# Patient Record
Sex: Female | Born: 1975 | Race: White | Hispanic: No | Marital: Married | Smoking: Never smoker
Health system: Southern US, Community
[De-identification: ages and names within clinical notes are randomized; demographics above are authoritative.]

## PROBLEM LIST (undated history)

## (undated) DIAGNOSIS — I10 Essential (primary) hypertension: Secondary | ICD-10-CM

## (undated) DIAGNOSIS — G43909 Migraine, unspecified, not intractable, without status migrainosus: Secondary | ICD-10-CM

## (undated) DIAGNOSIS — E282 Polycystic ovarian syndrome: Secondary | ICD-10-CM

## (undated) HISTORY — PX: CHOLECYSTECTOMY: SHX55

---

## 2018-01-02 ENCOUNTER — Encounter (HOSPITAL_COMMUNITY): Payer: Self-pay | Admitting: *Deleted

## 2018-01-02 ENCOUNTER — Emergency Department (HOSPITAL_COMMUNITY)
Admission: EM | Admit: 2018-01-02 | Discharge: 2018-01-02 | Disposition: A | Discharge status: Home / Self Care | Payer: 59 | Attending: Emergency Medicine | Admitting: Emergency Medicine

## 2018-01-02 ENCOUNTER — Emergency Department (HOSPITAL_COMMUNITY): Payer: 59

## 2018-01-02 DIAGNOSIS — N12 Tubulo-interstitial nephritis, not specified as acute or chronic: Secondary | ICD-10-CM | POA: Insufficient documentation

## 2018-01-02 DIAGNOSIS — Z79899 Other long term (current) drug therapy: Secondary | ICD-10-CM | POA: Diagnosis not present

## 2018-01-02 DIAGNOSIS — R1032 Left lower quadrant pain: Secondary | ICD-10-CM | POA: Diagnosis present

## 2018-01-02 DIAGNOSIS — I10 Essential (primary) hypertension: Secondary | ICD-10-CM | POA: Diagnosis not present

## 2018-01-02 HISTORY — DX: Essential (primary) hypertension: I10

## 2018-01-02 HISTORY — DX: Migraine, unspecified, not intractable, without status migrainosus: G43.909

## 2018-01-02 HISTORY — DX: Polycystic ovarian syndrome: E28.2

## 2018-01-02 LAB — BASIC METABOLIC PANEL
ANION GAP: 8 (ref 5–15)
BUN: 18 mg/dL (ref 6–20)
CHLORIDE: 110 mmol/L (ref 101–111)
CO2: 21 mmol/L — ABNORMAL LOW (ref 22–32)
Calcium: 8.8 mg/dL — ABNORMAL LOW (ref 8.9–10.3)
Creatinine, Ser: 0.82 mg/dL (ref 0.44–1.00)
GFR calc Af Amer: >60 mL/min (ref >60)
GLUCOSE: 115 mg/dL — AB (ref 65–99)
POTASSIUM: 4 mmol/L (ref 3.5–5.1)
SODIUM: 139 mmol/L (ref 135–145)

## 2018-01-02 LAB — URINALYSIS, ROUTINE W REFLEX MICROSCOPIC

## 2018-01-02 LAB — CBC WITH DIFFERENTIAL/PLATELET
BASOS ABS: 0 10*3/uL (ref 0.0–0.1)
Basophils Relative: 0 %
Eosinophils Absolute: 0.2 10*3/uL (ref 0.0–0.7)
Eosinophils Relative: 2 %
HEMATOCRIT: 37.8 % (ref 36.0–46.0)
Hemoglobin: 12.4 g/dL (ref 12.0–15.0)
LYMPHS ABS: 2.7 10*3/uL (ref 0.7–4.0)
LYMPHS PCT: 32 %
MCH: 29.2 pg (ref 26.0–34.0)
MCHC: 32.8 g/dL (ref 30.0–36.0)
MCV: 88.9 fL (ref 78.0–100.0)
Monocytes Absolute: 0.7 10*3/uL (ref 0.1–1.0)
Monocytes Relative: 8 %
NEUTROS ABS: 4.9 10*3/uL (ref 1.7–7.7)
Neutrophils Relative %: 58 %
Platelets: 394 10*3/uL (ref 150–400)
RBC: 4.25 MIL/uL (ref 3.87–5.11)
RDW: 13 % (ref 11.5–15.5)
WBC: 8.4 10*3/uL (ref 4.0–10.5)

## 2018-01-02 LAB — URINALYSIS, MICROSCOPIC (REFLEX)

## 2018-01-02 LAB — POC URINE PREG, ED: PREG TEST UR: NEGATIVE

## 2018-01-02 MED ORDER — HYDROMORPHONE HCL 1 MG/ML IJ SOLN
1.0000 mg | Freq: Once | INTRAMUSCULAR | Status: AC
  Administered 2018-01-02: 0.5 mg via INTRAVENOUS
  Filled 2018-01-02: qty 1

## 2018-01-02 MED ORDER — HYDROCODONE-ACETAMINOPHEN 5-325 MG PO TABS: 1.0000 | ORAL_TABLET | Freq: Four times a day (QID) | ORAL | 0 refills | Status: AC | PRN

## 2018-01-02 MED ORDER — ONDANSETRON 4 MG PO TBDP: 4.0000 mg | ORAL_TABLET | Freq: Three times a day (TID) | ORAL | 0 refills | Status: AC | PRN

## 2018-01-02 MED ORDER — SODIUM CHLORIDE 0.9 % IV BOLUS (SEPSIS)
1000.0000 mL | Freq: Once | INTRAVENOUS | Status: AC
  Administered 2018-01-02: 1000 mL via INTRAVENOUS

## 2018-01-02 MED ORDER — SULFAMETHOXAZOLE-TRIMETHOPRIM 800-160 MG PO TABS: 1.0000 | ORAL_TABLET | Freq: Two times a day (BID) | ORAL | 0 refills | Status: AC

## 2018-01-02 MED ORDER — ONDANSETRON HCL 4 MG/2ML IJ SOLN
4.0000 mg | Freq: Once | INTRAMUSCULAR | Status: AC | PRN
  Administered 2018-01-02: 4 mg via INTRAVENOUS
  Filled 2018-01-02: qty 2

## 2018-01-02 NOTE — ED Notes (Signed)
IV Ultrasound requested. Charge is in route to room.

## 2018-01-02 NOTE — ED Notes (Signed)
Pain in lower left back and suprapubic region. Pain in lower back began this morning.

## 2018-01-02 NOTE — ED Notes (Signed)
ED PA at bedside

## 2018-01-02 NOTE — Discharge Instructions (Signed)
Please read instructions below. Take your antibiotic, Bactrim, twice daily until it is gone. Drink plenty of water. You can take hydrocodone every 6 hours as needed for severe pain. Do not drive, drink alcohol, or take tylenol while taking this medication. You can take zofran every 8 hours as needed for nausea. Schedule an appointment with your primary care provider to follow up in 1 week. Return to the ER if you develop a fever, uncontrollable vomiting, or new or concerning symptoms.

## 2018-01-02 NOTE — ED Provider Notes (Signed)
[facility] Provider Note   CSN: [id number] Arrival date & time: 01/02/18  0734     History   Chief Complaint Chief Complaint  Patient presents with  . Flank Pain    HPI Judy Solis is a 42 y.o. female with past medical history of hypertension, PCO S, nephrolithiasis, presenting to the ED with 2 days of dysuria.  Patient states she has had burning with urination and suprapubic discomfort.  She states this morning she began having left flank pain, that is intermittent and stabbing in nature.  Also states her urine today was red.  Has been taking AZO for the past 2 days for urinary symptoms without relief.  States the flank pain feels similar to her history of nephrolithiasis, last episode about 7 years ago.  States she did not need intervention to pass stone.  Denies associated fever, nausea or vomiting, pelvic complaints.  The history is provided by the patient.    Past Medical History:  Diagnosis Date  . Hypertension   . Migraines   . Polycystic ovaries     There are no active problems to display for this patient.   Past Surgical History:  Procedure Laterality Date  . CHOLECYSTECTOMY      OB History    No data available       Home Medications    Prior to Admission medications   Medication Sig Start Date End Date Taking? Authorizing Provider  ibuprofen (ADVIL,MOTRIN) 200 MG tablet Take 800 mg by mouth every 6 (six) hours as needed for mild pain.   Yes [provider]  lisinopril (PRINIVIL,ZESTRIL) 10 MG tablet Take 10 mg by mouth daily.   Yes [provider]  loratadine (CLARITIN) 10 MG tablet Take 10 mg by mouth daily as needed for allergies.   Yes [provider]  norethindrone-ethinyl estradiol (BALZIVA) 0.4-35 MG-MCG tablet Take 1 tablet by mouth daily.   Yes [provider]  phenazopyridine (PYRIDIUM) 95 MG tablet Take 95 mg by mouth 3 (three) times daily as needed for pain.   Yes [provider]  propranolol (INDERAL) 40 MG tablet Take 40 mg by mouth 2 (two) times daily.   Yes [provider]  HYDROcodone-acetaminophen (NORCO/VICODIN) 5-325 MG tablet Take 1-2 tablets by mouth every 6 (six) hours as needed for severe pain. 01/02/18   Robinson, [place] N, PA-C  ondansetron (ZOFRAN ODT) 4 MG disintegrating tablet Take 1 tablet (4 mg total) by mouth every 8 (eight) hours as needed for nausea or vomiting. 01/02/18   Robinson, [place] N, PA-C  sulfamethoxazole-trimethoprim (BACTRIM DS,SEPTRA DS) 800-160 MG tablet Take 1 tablet by mouth 2 (two) times daily for 14 days. 01/02/18 01/16/18  Robinson, [place] N, PA-C    Family History No family history on file.  Social History Social History   Tobacco Use  . Smoking status: Never Smoker  . Smokeless tobacco: Never Used  Substance Use Topics  . Alcohol use: Yes  . Drug use: No     Allergies   Tuberculin tests   Review of Systems Review of Systems  Constitutional: Negative for chills and fever.  Gastrointestinal: Positive for abdominal pain. Negative for nausea and vomiting.  Genitourinary: Positive for dysuria, flank pain and hematuria. Negative for vaginal bleeding and vaginal discharge.  All other systems reviewed and are negative.    Physical Exam Updated Vital Signs BP 129/82 (BP Location: Right Arm)   Pulse 74   Temp 98 F (36.7 C) (Oral)  Resp 18   LMP 11/28/2017 Comment: negative pregnancy test result 01-02-18  SpO2 100%   Physical Exam  Constitutional: She appears well-developed and well-nourished. No distress.  HENT:  Head: Normocephalic and atraumatic.  Mouth/Throat: Oropharynx is clear and moist.  Eyes: Conjunctivae are normal.  Cardiovascular: Normal rate, regular rhythm, normal heart sounds and intact distal pulses.  Pulmonary/Chest: Effort normal and breath sounds normal. No respiratory distress.  Abdominal: Soft. Normal appearance and bowel sounds are normal. She exhibits no distension and  no mass. There is tenderness (suprapubic). There is CVA tenderness (left). There is no rebound and no guarding.  Neurological: She is alert.  Skin: Skin is warm.  Psychiatric: She has a normal mood and affect. Her behavior is normal.  Nursing note and vitals reviewed.    ED Treatments / Results  Labs (all labs ordered are listed, but only abnormal results are displayed) Labs Reviewed  URINALYSIS, ROUTINE W REFLEX MICROSCOPIC - Abnormal; Notable for the following components:      Result Value   Color, Urine RED (*)    APPearance CLOUDY (*)    Glucose, UA   (*)    Value: TEST NOT REPORTED DUE TO COLOR INTERFERENCE OF URINE PIGMENT   Hgb urine dipstick   (*)    Value: TEST NOT REPORTED DUE TO COLOR INTERFERENCE OF URINE PIGMENT   Bilirubin Urine   (*)    Value: TEST NOT REPORTED DUE TO COLOR INTERFERENCE OF URINE PIGMENT   Ketones, ur   (*)    Value: TEST NOT REPORTED DUE TO COLOR INTERFERENCE OF URINE PIGMENT   Protein, ur   (*)    Value: TEST NOT REPORTED DUE TO COLOR INTERFERENCE OF URINE PIGMENT   Nitrite   (*)    Value: TEST NOT REPORTED DUE TO COLOR INTERFERENCE OF URINE PIGMENT   Leukocytes, UA   (*)    Value: TEST NOT REPORTED DUE TO COLOR INTERFERENCE OF URINE PIGMENT   All other components within normal limits  BASIC METABOLIC PANEL - Abnormal; Notable for the following components:   CO2 21 (*)    Glucose, Bld 115 (*)    Calcium 8.8 (*)    All other components within normal limits  URINALYSIS, MICROSCOPIC (REFLEX) - Abnormal; Notable for the following components:   Bacteria, UA MANY (*)    Squamous Epithelial / LPF 0-5 (*)    All other components within normal limits  URINE CULTURE  CBC WITH DIFFERENTIAL/PLATELET  POC URINE PREG, ED    EKG  EKG Interpretation None       Radiology Ct Renal Stone Study  Result Date: 01/02/2018 CLINICAL DATA:  43 year old female with 2 hours of left flank pain, and burning with urination for the past 2 days. EXAM: CT ABDOMEN  AND PELVIS WITHOUT CONTRAST TECHNIQUE: Multidetector CT imaging of the abdomen and pelvis was performed following the standard protocol without IV contrast. COMPARISON:  None. FINDINGS: Lower chest: Normal lung bases.  No pericardial or pleural effusion. Hepatobiliary: Surgically absent gallbladder. Negative noncontrast liver. Pancreas: Negative. Spleen: Negative. Adrenals/Urinary Tract: Normal adrenal glands. Punctate left lower pole nephrolithiasis. Punctate right midpole and 3 millimeter lower pole right nephrolithiasis. No perinephric stranding. No hydronephrosis. Negative left ureter. Negative right ureter. Unremarkable urinary bladder. Tiny left pelvic floor phlebolith. Stomach/Bowel: Decompressed and negative rectosigmoid colon. Decompressed and negative left colon. Negative transverse colon. Mild retained stool in the right colon. Normal appendix. Flocculated material in the distal small bowel which otherwise appears normal. No dilated  small bowel. Decompressed stomach and duodenum. No abdominal free air or free fluid. Vascular/Lymphatic: Vascular patency is not evaluated in the absence of IV contrast. No lymphadenopathy. Reproductive: Negative. Other: No pelvic free fluid. Musculoskeletal: No acute osseous abnormality identified. IMPRESSION: 1. Bilateral nephrolithiasis but no obstructive uropathy or inflammation identified in the abdomen or pelvis. 2. Normal appendix. Electronically Signed   By: Odessa Fleming M.D.   On: 01/02/2018 09:08    Procedures Procedures (including critical care time)  Medications Ordered in ED Medications  sodium chloride 0.9 % bolus 1,000 mL (0 mLs Intravenous Stopped 01/02/18 1054)  ondansetron (ZOFRAN) injection 4 mg (4 mg Intravenous Given 01/02/18 0921)  HYDROmorphone (DILAUDID) injection 1 mg (0.5 mg Intravenous Given 01/02/18 0921)     Initial Impression / Assessment and Plan / ED Course  I have reviewed the triage vital signs and the nursing notes.  Pertinent labs &  imaging results that were available during my care of the patient were reviewed by me and considered in my medical decision making (see chart for details).  Clinical Course as of Jan 02 1149  Sun Jan 02, 2018  1110 Results discussed with patient. She reports improvement in sx. Still denies nausea. Will d/c with abx and symptomatic medications.  [JR]    Clinical Course User Index [JR] Robinson, [place] N, PA-C    Pt with urinary sx and left flank pain, diagnosed with pyelonephritis. Afebrile, nontoxic-appearing. VSS. CT stone study w small stone bilaterally in poles, though no evidence of obstructive uropathy or inflammation. Normal creatinine. No leukocytosis. U/A with infection. Pt's sx treated in ED with improvement. Will discharge with Bactrim, and symptomatic management. Instructed to follow up with PCP. Strict return precautions discussed.   Patient discussed with Dr. Eudelia Bunch.  [place] Controlled Substance reporting System queried  Discussed results, findings, treatment and follow up. Patient advised of return precautions. Patient verbalized understanding and agreed with plan.   Final Clinical Impressions(s) / ED Diagnoses   Final diagnoses:  Pyelonephritis    ED Discharge Orders        Ordered    sulfamethoxazole-trimethoprim (BACTRIM DS,SEPTRA DS) 800-160 MG tablet  2 times daily     01/02/18 1114    ondansetron (ZOFRAN ODT) 4 MG disintegrating tablet  Every 8 hours PRN     01/02/18 1114    HYDROcodone-acetaminophen (NORCO/VICODIN) 5-325 MG tablet  Every 6 hours PRN     01/02/18 1114       Robinson, [place] N, [place] 01/02/18 1151    Nira Conn, MD 01/02/18 1745

## 2018-01-02 NOTE — ED Triage Notes (Signed)
Pt complains of left flank pain for the past 2 hours, burning while urinating for the past 2 days. Pt states pain became worse when she got in her car. Pt believes she has UTI, has hx of UTIs and kidney stones. Pt has been taken AZO for the past 2 days.

## 2018-01-04 LAB — URINE CULTURE: Culture: >=100000 — AB

## 2018-01-05 ENCOUNTER — Telehealth: Payer: Self-pay | Admitting: Emergency Medicine

## 2018-01-05 NOTE — Telephone Encounter (Signed)
Post ED Visit - Positive Culture Follow-up  Culture report reviewed by antimicrobial stewardship pharmacist:  []  Enzo BiNathan Batchelder, Pharm.D. []  Celedonio MiyamotoJeremy Frens, Pharm.D., BCPS AQ-ID []  Garvin FilaMike Maccia, Pharm.D., BCPS []  Georgina PillionElizabeth Martin, [address]Ph[address]D., BCPS []  Quartz HillMinh Pham, [place]arm[place]D., BCPS, AAHIVP []  Estella HuskMichelle Turner, Pharm.D., BCPS, AAHIVP []  Lysle Pearlachel Rumbarger, PharmD, BCPS []  Blake DivineShannon Parkey, PharmD []  Pollyann SamplesAndy Johnston, PharmD, BCPS [facility] [place] PharmD  Positive urine culture Treated with Sulfamethazole-trimethoprim, organism sensitive to the same and no further patient follow-up is required at this time.  Berle MullMiller, Zoey Bidwell 01/05/2018, 11:24 AM

## 2018-07-27 IMAGING — CT CT RENAL STONE PROTOCOL
2 of 4 series · 16 of 46 positions shown, 18 images · non-contrast
Comparison: None.

CLINICAL DATA: 41-year-old female with 2 hours of left flank pain,
and burning with urination for the past 2 days.

EXAM:
CT ABDOMEN AND PELVIS WITHOUT CONTRAST
TECHNIQUE: Multidetector CT imaging of the abdomen and pelvis was performed
following the standard protocol without IV contrast.

[Series 2: axial st · axial · 0.82mm/px · z∈[-435,-20]mm · 13 of 95 slices shown, 15 images]
[im 6/95  soft-tissue]
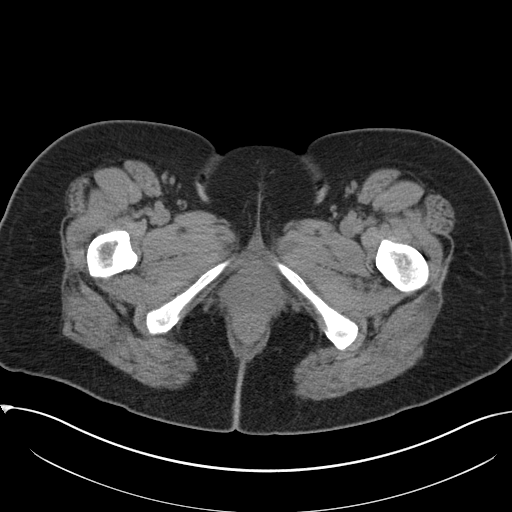
[im 6/95  bone]
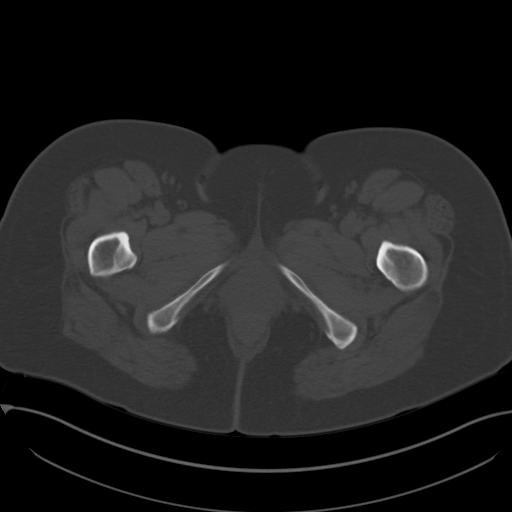
[im 11/95  soft-tissue]
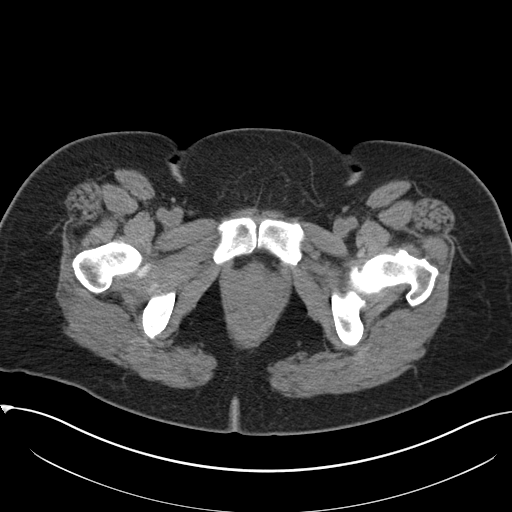
[im 21/95  soft-tissue]
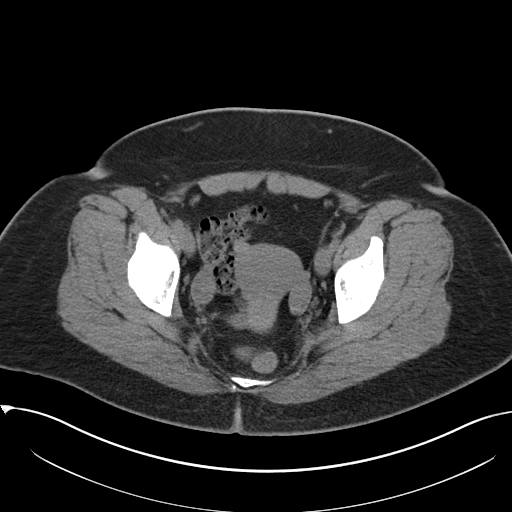
[im 27/95  soft-tissue]
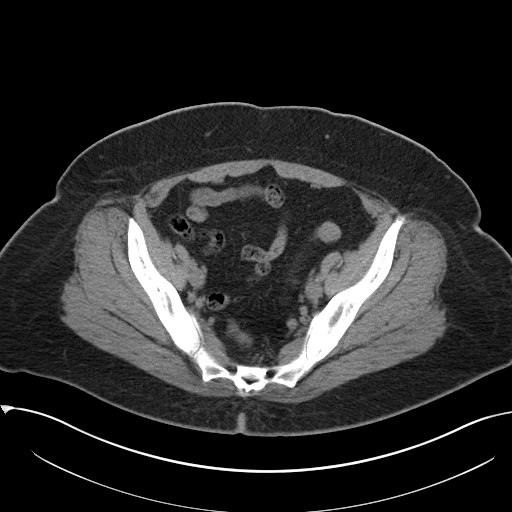
[im 32/95  soft-tissue]
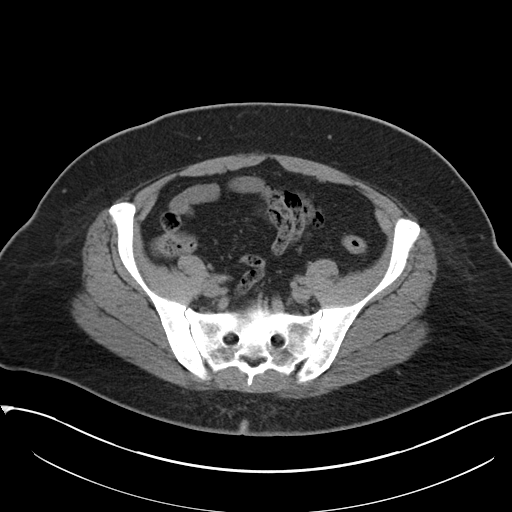
[im 42/95  soft-tissue]
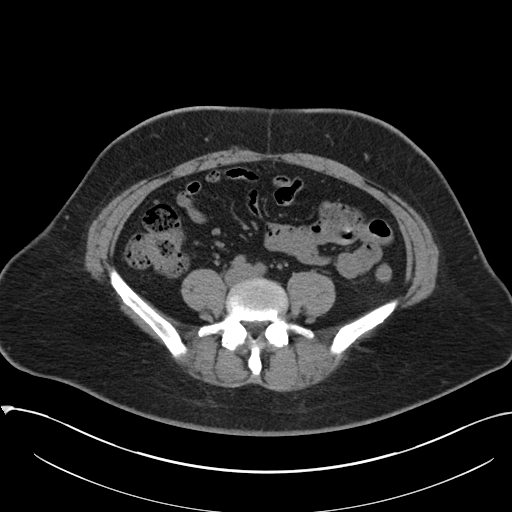
[im 48/95  soft-tissue]
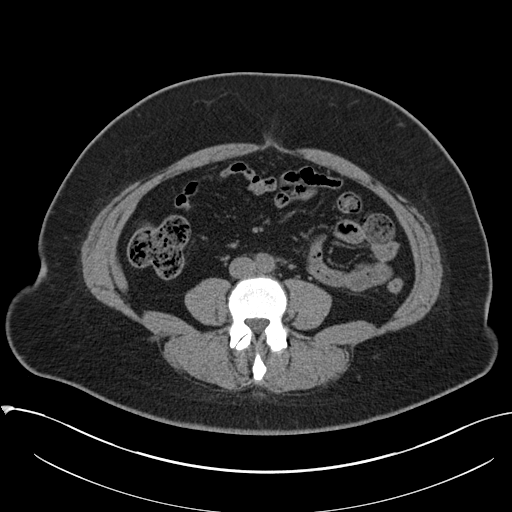
[im 53/95  soft-tissue]
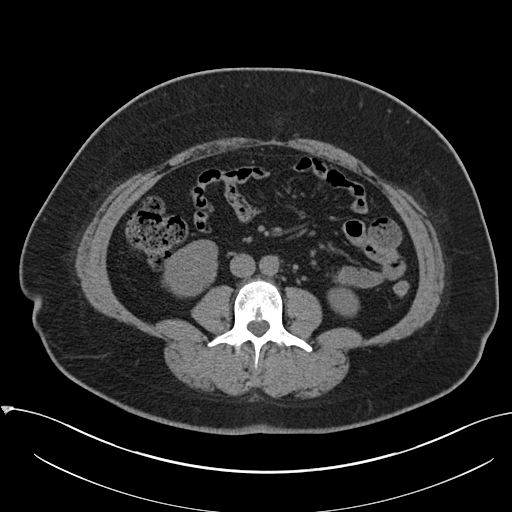
[im 63/95  soft-tissue]
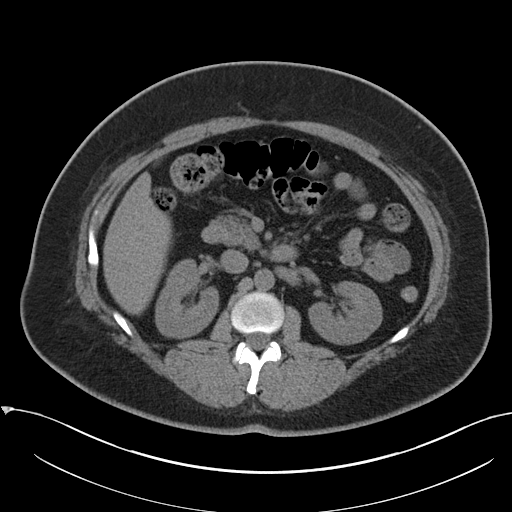
[im 63/95  bone]
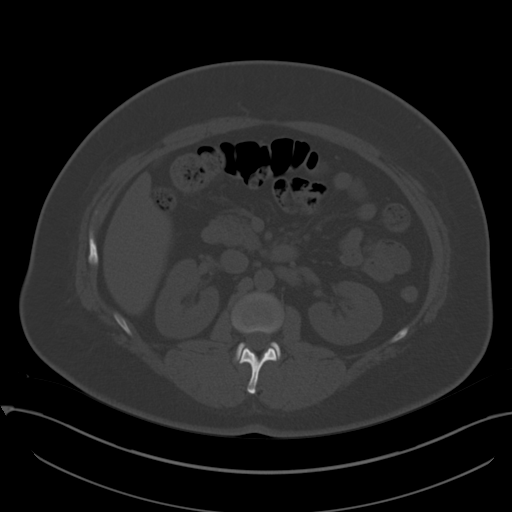
[im 68/95  soft-tissue]
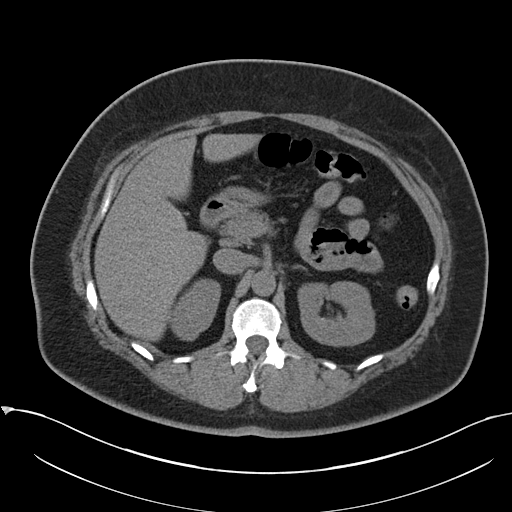
[im 74/95  soft-tissue]
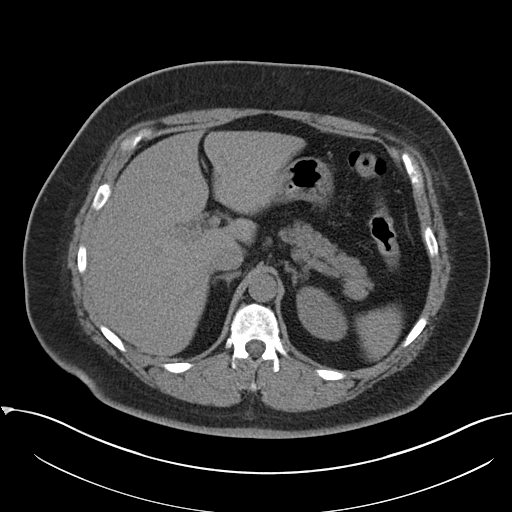
[im 84/95  soft-tissue]
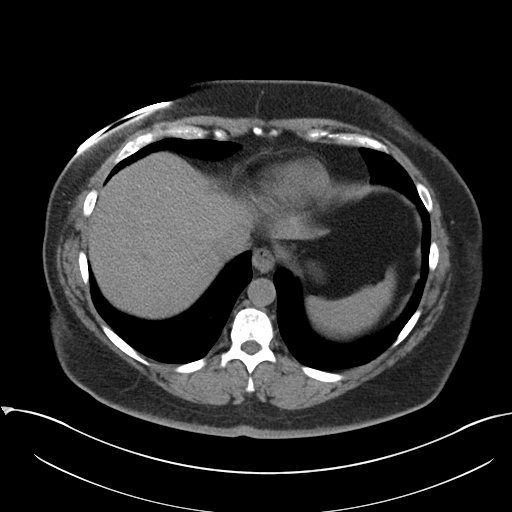
[im 89/95  soft-tissue]
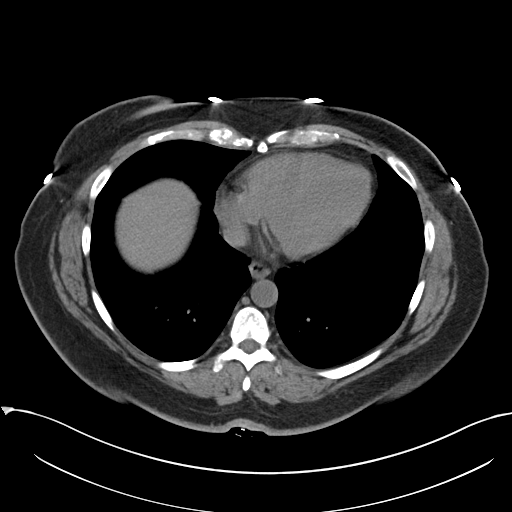

[Series 5: coronal · coronal · 0.75mm/px · 3 of 151 slices shown]
[im 51/151  soft-tissue]
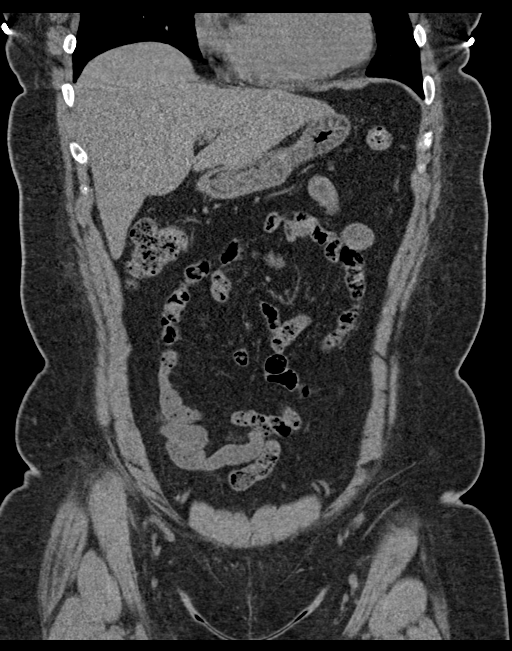
[im 67/151  soft-tissue]
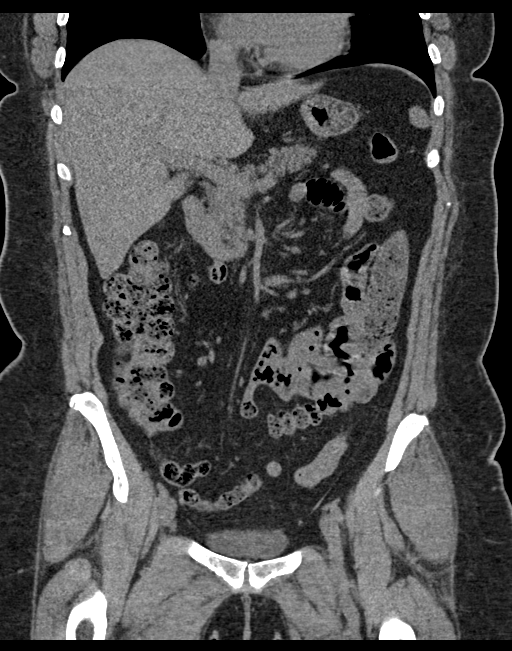
[im 84/151  soft-tissue]
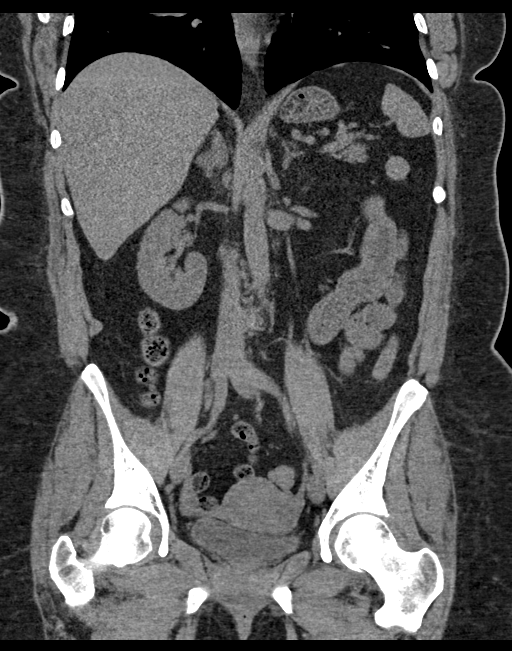

[16 of 46 positions shown; findings below may reference images not displayed]

FINDINGS: Lower chest: Normal lung bases.  No pericardial or pleural effusion.

Hepatobiliary: Surgically absent gallbladder. Negative noncontrast
liver.

Pancreas: Negative.

Spleen: Negative.

Adrenals/Urinary Tract:

Normal adrenal glands.

Punctate left lower pole nephrolithiasis. Punctate right midpole and
3 millimeter lower pole right nephrolithiasis. No perinephric
stranding. No hydronephrosis. Negative left ureter. Negative right
ureter.

Unremarkable urinary bladder. Tiny left pelvic floor phlebolith.

Stomach/Bowel: Decompressed and negative rectosigmoid colon.
Decompressed and negative left colon. Negative transverse colon.
Mild retained stool in the right colon. Normal appendix.

Flocculated material in the distal small bowel which otherwise
appears normal. No dilated small bowel. Decompressed stomach and
duodenum.

No abdominal free air or free fluid.

Vascular/Lymphatic: Vascular patency is not evaluated in the absence
of IV contrast. No lymphadenopathy.

Reproductive: Negative.

Other: No pelvic free fluid.

Musculoskeletal: No acute osseous abnormality identified.
IMPRESSION: 1. Bilateral nephrolithiasis but no obstructive uropathy or
inflammation identified in the abdomen or pelvis.
2. Normal appendix.
